# Patient Record
Sex: Female | Born: 1995 | Race: White | Hispanic: No | Marital: Single | Smoking: Never smoker
Health system: Southern US, Community
[De-identification: ages and names within clinical notes are randomized; demographics above are authoritative.]

## PROBLEM LIST (undated history)

## (undated) DIAGNOSIS — J302 Other seasonal allergic rhinitis: Secondary | ICD-10-CM

## (undated) DIAGNOSIS — R42 Dizziness and giddiness: Secondary | ICD-10-CM

## (undated) HISTORY — DX: Dizziness and giddiness: R42

## (undated) HISTORY — DX: Other seasonal allergic rhinitis: J30.2

---

## 2014-08-05 HISTORY — PX: WISDOM TOOTH EXTRACTION: SHX21

## 2020-07-05 ENCOUNTER — Encounter: Payer: Self-pay | Admitting: Neurology

## 2020-09-12 NOTE — Progress Notes (Addendum)
NEUROLOGY CONSULTATION NOTE  Caroline Booth MRN: [id number] DOB: [birth year]  Referring provider: Crist Fat, MD Primary care provider: Crist Fat, MD  Reason for consult:  Dizziness/vertigo   Subjective:  Caroline Booth is a 25 year old right-handed female who presents for dizziness/vertigo.  History supplemented by referring provider's note.  In November, she began experiencing dizziness.  She was standing at work and room started spinning spontaneously lasting 10 to 15 seconds.  It was not triggered by head movement or change in position.  This was followed by a lightheaded feeling (like she was going to pass out).  This feeling lasted 2 weeks.  No headache, nausea, vomiting, double vision, tinnitus, weakness, or palpitations, but noted numbness on the dorsum of feet and arms felt numb. The feeling wasn't aggravated or relieved it.  She would need to lay down at times because she felt like she may pass out, but she never did.  It lasted a couple of weeks and then resolved. She went to vestibular rehab where they were unable to reproduce it.  They didn't think it was positional.  In December, she had a recurrence of the same event, sudden onset vertigo for several seconds followed by 2 weeks of persistent dizziness/lightheadedness.  It happened a third time on New Year's Eve but symptoms only lasted a day.    She denies prior history of migraines.       PAST MEDICAL HISTORY: Seasonal allergies  PAST SURGICAL HISTORY: Foot/toe nail surgery Wisdom teeth extraction  MEDICATIONS: None  ALLERGIES: Cats Penicillin  FAMILY HISTORY: History reviewed. No pertinent family history.  SOCIAL HISTORY: Social History   Socioeconomic History  . Marital status: Single    Spouse name: Not on file  . Number of children: Not on file  . Years of education: Not on file  . Highest education level: Not on file  Occupational History  . Not on file  Tobacco Use  . Smoking status: Never  Smoker  . Smokeless tobacco: Never Used  Vaping Use  . Vaping Use: Never used  Substance and Sexual Activity  . Alcohol use: Yes    Comment: occ  . Drug use: Never  . Sexual activity: Not on file  Other Topics Concern  . Not on file  Social History Narrative   Right handed   Social Determinants of Health   Financial Resource Strain: Not on file  Food Insecurity: Not on file  Transportation Needs: Not on file  Physical Activity: Not on file  Stress: Not on file  Social Connections: Not on file  Intimate Partner Violence: Not on file    Objective:  Blood pressure 120/82, pulse 99, height 5\' 7"  (1.702 m), weight 202 lb 6.4 oz (91.8 kg), SpO2 94 %.  General: No acute distress.  Patient appears well-groomed.   Head:  Normocephalic/atraumatic Eyes:  fundi examined but not visualized Neck: supple, no paraspinal tenderness, full range of motion Back: No paraspinal tenderness Heart: regular rate and rhythm Lungs: Clear to auscultation bilaterally. Vascular: No carotid bruits. Neurological Exam: Mental status: alert and oriented to person, place, and time, recent and remote memory intact, fund of knowledge intact, attention and concentration intact, speech fluent and not dysarthric, language intact. Cranial nerves: CN I: not tested CN II: pupils equal, round and reactive to light, visual fields intact CN III, IV, VI:  full range of motion, no nystagmus, no ptosis CN V: facial sensation intact. CN VII: upper and lower face symmetric CN VIII:  hearing intact CN IX, X: gag intact, uvula midline CN XI: sternocleidomastoid and trapezius muscles intact CN XII: tongue midline Bulk & Tone: normal, no fasciculations. Motor:  muscle strength 5/5 throughout Sensation:  Pinprick, temperature and vibratory sensation intact. Deep Tendon Reflexes:  2+ throughout,  toes downgoing.   Finger to nose testing:  Without dysmetria.   Heel to shin:  Without dysmetria.   Gait:  Normal station and  stride.  Romberg negative.  Assessment/Plan:   1.  Episodic dizziness/altered sensorium with intermittent numbness and tingling - initiated by brief vertigo.  Not consistent with BPPV.  From a neurologic standpoint, may consider a migraine aura/vestibular migraine, atypical seizures less likely.  Given the lightheadedness, may consider cardiac etiology as well.  1.  Will check MRI of brain with and without contrast for intracranial abnormality 2.  Will check routine EEG 3.  Further recommendations pending results.  Follow up after testing.  If she should have a recurrence and wants to try treating for migraine, she will contact me and we can start a medication such as topiramate. 4.  If workup negative, she may want to discuss with her PCP about possible cardiac evaluation as well.    Thank you for allowing me to take part in the care of this patient.  Shon Millet, DO  CC: Crist Fat, MD  10/02/2020 ADDENDUM:  EEG from 09/25/2020 normal.  MRI Brain with and without contrast on 10/01/2020 normal.  No further neurologic workup warranted. Shon Millet

## 2020-09-13 ENCOUNTER — Other Ambulatory Visit: Payer: Self-pay

## 2020-09-13 ENCOUNTER — Encounter: Payer: Self-pay | Admitting: Neurology

## 2020-09-13 ENCOUNTER — Ambulatory Visit (INDEPENDENT_AMBULATORY_CARE_PROVIDER_SITE_OTHER): Payer: Managed Care, Other (non HMO) | Admitting: Neurology

## 2020-09-13 VITALS — BP 120/82 | HR 99 | Ht 67.0 in | Wt 202.4 lb

## 2020-09-13 DIAGNOSIS — R200 Anesthesia of skin: Secondary | ICD-10-CM | POA: Diagnosis not present

## 2020-09-13 DIAGNOSIS — R404 Transient alteration of awareness: Secondary | ICD-10-CM | POA: Diagnosis not present

## 2020-09-13 DIAGNOSIS — R202 Paresthesia of skin: Secondary | ICD-10-CM

## 2020-09-13 DIAGNOSIS — R42 Dizziness and giddiness: Secondary | ICD-10-CM | POA: Diagnosis not present

## 2020-09-13 NOTE — Patient Instructions (Addendum)
Etiology unclear, but if it is neurologic, it may be an atypical migraine 1.  We will check MRI of brain with and without contrast. We have sent a referral to [facility] for your MRI and they will call you directly to schedule your appointment. They are located at [address] [facility]. If you need to contact them directly please call [phone].  2.  We will check routine EEG 3.  Further recommendations pending results.  If you should have a recurrence and feel you may need to start a migraine medication, contact me 4.  If testing unremarkable, may want to discuss with your PCP about cardiac evaluation as well. 5.  Follow up after testing

## 2020-09-25 ENCOUNTER — Other Ambulatory Visit: Payer: Self-pay

## 2020-09-25 ENCOUNTER — Ambulatory Visit (INDEPENDENT_AMBULATORY_CARE_PROVIDER_SITE_OTHER): Payer: Managed Care, Other (non HMO) | Admitting: Neurology

## 2020-09-25 DIAGNOSIS — R404 Transient alteration of awareness: Secondary | ICD-10-CM | POA: Diagnosis not present

## 2020-09-25 NOTE — Procedures (Signed)
ELECTROENCEPHALOGRAM REPORT  Date of Study: 09/25/2020  Patient's Name: Caroline Booth MRN: [id number] Date of Birth: [birth year]  Clinical History: 24 year old female with recurrent dizzy spells.  Medications: None  Technical Summary: A multichannel digital EEG recording measured by the international 10-20 system with electrodes applied with paste and impedances below 5000 ohms performed in our laboratory with EKG monitoring in an awake and drowsy patient.  Hyperventilation not performed as patient is wearing a face mask due to the COVID-19 pandemic.  Photic stimulation was performed.  The digital EEG was referentially recorded, reformatted, and digitally filtered in a variety of bipolar and referential montages for optimal display.    Description: The patient is awake and drowsy during the recording.  During maximal wakefulness, there is a symmetric, medium voltage 11-12 Hz posterior dominant rhythm that attenuates with eye opening.  The record is symmetric.  During drowsiness, there is an increase in theta slowing of the background.  Stage 2 sleep was not seen.  Photic stimulation did not elicit any abnormalities.  There were no epileptiform discharges or electrographic seizures seen.    EKG lead was unremarkable.  Impression: This awake and drowsy EEG is normal.    Clinical Correlation: A normal EEG does not exclude a clinical diagnosis of epilepsy.  If further clinical questions remain, prolonged EEG may be helpful.  Clinical correlation is advised.   Shon Millet, DO

## 2020-09-27 ENCOUNTER — Telehealth: Payer: Self-pay

## 2020-09-27 NOTE — Telephone Encounter (Signed)
-----   Message from Drema Dallas, DO sent at 09/25/2020  4:11 PM EST ----- EEG normal

## 2020-09-27 NOTE — Telephone Encounter (Signed)
Called and informed patient of normal eeg.

## 2020-10-01 ENCOUNTER — Other Ambulatory Visit: Payer: Self-pay

## 2020-10-01 ENCOUNTER — Ambulatory Visit
Admission: RE | Admit: 2020-10-01 | Discharge: 2020-10-01 | Disposition: A | Discharge status: Home / Self Care | Payer: Managed Care, Other (non HMO) | Source: Ambulatory Visit | Attending: Neurology | Admitting: Neurology

## 2020-10-01 DIAGNOSIS — R404 Transient alteration of awareness: Secondary | ICD-10-CM

## 2020-10-01 DIAGNOSIS — R42 Dizziness and giddiness: Secondary | ICD-10-CM

## 2020-10-01 DIAGNOSIS — R202 Paresthesia of skin: Secondary | ICD-10-CM

## 2020-10-01 DIAGNOSIS — R200 Anesthesia of skin: Secondary | ICD-10-CM

## 2020-10-01 MED ORDER — GADOBENATE DIMEGLUMINE 529 MG/ML IV SOLN
19.0000 mL | Freq: Once | INTRAVENOUS | Status: AC | PRN
  Administered 2020-10-01: 19 mL via INTRAVENOUS

## 2020-10-23 ENCOUNTER — Other Ambulatory Visit: Payer: Self-pay

## 2020-10-23 DIAGNOSIS — R42 Dizziness and giddiness: Secondary | ICD-10-CM | POA: Insufficient documentation

## 2020-10-24 ENCOUNTER — Encounter: Payer: Self-pay | Admitting: Cardiology

## 2020-10-24 ENCOUNTER — Ambulatory Visit: Payer: Managed Care, Other (non HMO) | Admitting: Cardiology

## 2020-10-24 ENCOUNTER — Ambulatory Visit (INDEPENDENT_AMBULATORY_CARE_PROVIDER_SITE_OTHER): Payer: Managed Care, Other (non HMO)

## 2020-10-24 ENCOUNTER — Other Ambulatory Visit: Payer: Self-pay

## 2020-10-24 VITALS — BP 116/80 | HR 104 | Ht 67.0 in | Wt 200.0 lb

## 2020-10-24 DIAGNOSIS — R000 Tachycardia, unspecified: Secondary | ICD-10-CM | POA: Diagnosis not present

## 2020-10-24 DIAGNOSIS — R002 Palpitations: Secondary | ICD-10-CM | POA: Insufficient documentation

## 2020-10-24 DIAGNOSIS — R011 Cardiac murmur, unspecified: Secondary | ICD-10-CM | POA: Insufficient documentation

## 2020-10-24 DIAGNOSIS — R42 Dizziness and giddiness: Secondary | ICD-10-CM | POA: Diagnosis not present

## 2020-10-24 NOTE — Patient Instructions (Signed)
Medication Instructions:  No medication changes. *If you need a refill on your cardiac medications before your next appointment, please call your pharmacy*   Lab Work: None ordered If you have labs (blood work) drawn today and your tests are completely normal, you will receive your results only by: Marland Kitchen MyChart Message (if you have MyChart) OR . A paper copy in the mail If you have any lab test that is abnormal or we need to change your treatment, we will call you to review the results.   Testing/Procedures: Your physician has requested that you have an echocardiogram. Echocardiography is a painless test that uses sound waves to create images of your heart. It provides your doctor with information about the size and shape of your heart and how well your heart's chambers and valves are working. This procedure takes approximately one hour. There are no restrictions for this procedure.   WHY IS MY DOCTOR PRESCRIBING ZIO? The Zio system is proven and trusted by physicians to detect and diagnose irregular heart rhythms -- and has been prescribed to hundreds of thousands of patients.  The FDA has cleared the Zio system to monitor for many different kinds of irregular heart rhythms. In a study, physicians were able to reach a diagnosis 90% of the time with the Zio system1.  You can wear the Zio monitor -- a small, discreet, comfortable patch -- during your normal day-to-day activity, including while you sleep, shower, and exercise, while it records every single heartbeat for analysis.  1Barrett, P., et al. Comparison of 24 Hour Holter Monitoring Versus 14 Day Novel Adhesive Patch Electrocardiographic Monitoring. American Journal of Medicine, 2014.  ZIO VS. HOLTER MONITORING The Zio monitor can be comfortably worn for up to 14 days. Holter monitors can be worn for 24 to 48 hours, limiting the time to record any irregular heart rhythms you may have. Zio is able to capture data for the 51% of patients  who have their first symptom-triggered arrhythmia after 48 hours.1  LIVE WITHOUT RESTRICTIONS The Zio ambulatory cardiac monitor is a small, unobtrusive, and water-resistant patch--you might even forget you're wearing it. The Zio monitor records and stores every beat of your heart, whether you're sleeping, working out, or showering.  Wear the monitor for 2 weeks, remove 11/07/20.   Follow-Up: At [facility], you and your health needs are our priority.  As part of our continuing mission to provide you with exceptional heart care, we have created designated Provider Care Teams.  These Care Teams include your primary Cardiologist (physician) and Advanced Practice Providers (APPs -  Physician Assistants and Nurse Practitioners) who all work together to provide you with the care you need, when you need it.  We recommend signing up for the patient portal called "MyChart".  Sign up information is provided on this After Visit Summary.  MyChart is used to connect with patients for Virtual Visits (Telemedicine).  Patients are able to view lab/test results, encounter notes, upcoming appointments, etc.  Non-urgent messages can be sent to your provider as well.   To learn more about what you can do with MyChart, go to [email or web address].    Your next appointment:   2 month(s)  The format for your next appointment:   In Person  Provider:   Belva Crome, MD   Other Instructions  Echocardiogram An echocardiogram is a test that uses sound waves (ultrasound) to produce images of the heart. Images from an echocardiogram can provide important information about:  Heart size and shape.  The size and thickness and movement of your heart's walls.  Heart muscle function and strength.  Heart valve function or if you have stenosis. Stenosis is when the heart valves are too narrow.  If blood is flowing backward through the heart valves (regurgitation).  A tumor or infectious growth around the  heart valves.  Areas of heart muscle that are not working well because of poor blood flow or injury from a heart attack.  Aneurysm detection. An aneurysm is a weak or damaged part of an artery wall. The wall bulges out from the normal force of blood pumping through the body. Tell a health care provider about:  Any allergies you have.  All medicines you are taking, including vitamins, herbs, eye drops, creams, and over-the-counter medicines.  Any blood disorders you have.  Any surgeries you have had.  Any medical conditions you have.  Whether you are pregnant or may be pregnant. What are the risks? Generally, this is a safe test. However, problems may occur, including an allergic reaction to dye (contrast) that may be used during the test. What happens before the test? No specific preparation is needed. You may eat and drink normally. What happens during the test?  You will take off your clothes from the waist up and put on a hospital gown.  Electrodes or electrocardiogram (ECG)patches may be placed on your chest. The electrodes or patches are then connected to a device that monitors your heart rate and rhythm.  You will lie down on a table for an ultrasound exam. A gel will be applied to your chest to help sound waves pass through your skin.  A handheld device, called a transducer, will be pressed against your chest and moved over your heart. The transducer produces sound waves that travel to your heart and bounce back (or "echo" back) to the transducer. These sound waves will be captured in real-time and changed into images of your heart that can be viewed on a video monitor. The images will be recorded on a computer and reviewed by your health care provider.  You may be asked to change positions or hold your breath for a short time. This makes it easier to get different views or better views of your heart.  In some cases, you may receive contrast through an IV in one of your  veins. This can improve the quality of the pictures from your heart. The procedure may vary among health care providers and hospitals.   What can I expect after the test? You may return to your normal, everyday life, including diet, activities, and medicines, unless your health care provider tells you not to do that. Follow these instructions at home:  It is up to you to get the results of your test. Ask your health care provider, or the department that is doing the test, when your results will be ready.  Keep all follow-up visits. This is important. Summary  An echocardiogram is a test that uses sound waves (ultrasound) to produce images of the heart.  Images from an echocardiogram can provide important information about the size and shape of your heart, heart muscle function, heart valve function, and other possible heart problems.  You do not need to do anything to prepare before this test. You may eat and drink normally.  After the echocardiogram is completed, you may return to your normal, everyday life, unless your health care provider tells you not to do that. This information is not intended to replace advice  given to you by your health care provider. Make sure you discuss any questions you have with your health care provider. Document Revised: 03/14/2020 Document Reviewed: 03/14/2020 Elsevier Patient Education  2021 [employer].

## 2020-10-24 NOTE — Progress Notes (Signed)
Cardiology Office Note:    Date:  10/24/2020   ID:  Caroline Booth, DOB 08-15-1995, MRN [id number]  PCP:  Crist Fat, MD  Cardiologist:  Garwin Brothers, MD   Referring MD: Crist Fat, MD    ASSESSMENT:    1. Dizziness   2. Sinus tachycardia   3. Palpitations   4. Cardiac murmur    PLAN:    In order of problems listed above:  1. Primary prevention stressed with the patient.  Importance of compliance with diet medication stressed and she vocalized understanding. 2. Palpitations: Sinus tachycardia: I discussed my findings with the patient at length and reassured her.  Recent TSH in December was unremarkable.  We will do a 2-week monitor to assess the symptoms.  She has never had a passing out spell.  She has had dizziness in the past.  Hopefully this monitor will help [place] understand her baseline rhythm.  I told her to keep herself well-hydrated.  I also mentioned to the patient to walk on a regular basis to the best of her ability.  And increase effort in a graded fashion. 3. Cardiac murmur: Echocardiogram will be done to assess murmur heard on auscultation.  She knows to go to the nearest emergency room for any concerning symptoms. 4. Patient will be seen in follow-up appointment in 3 months or earlier if the patient has any concerns    Medication Adjustments/Labs and Tests Ordered: Current medicines are reviewed at length with the patient today.  Concerns regarding medicines are outlined above.  No orders of the defined types were placed in this encounter.  No orders of the defined types were placed in this encounter.    History of Present Illness:    Caroline Booth is a 25 y.o. female who is being seen today for the evaluation of palpitations and sinus tachycardia and dizziness at the request of Crist Fat, MD.  Patient is a pleasant 25 year old female.  She has past medical history that is not much significant.  She has had history of vertigo.  She mentions to me that  she feels dizzy at times.  Her vertigo is much better.  She was sent here for evaluation for cardiac etiology for the symptoms as a possibility.  She tells me that her heart rate is fast and it has been so for a long time.  At the time of my evaluation, the patient is alert awake oriented and in no distress.  Past Medical History:  Diagnosis Date  . Dizziness   . Vertigo     Past Surgical History:  Procedure Laterality Date  . WISDOM TOOTH EXTRACTION  2016    Current Medications: Current Meds  Medication Sig  . fexofenadine-pseudoephedrine (ALLEGRA-D 24) 180-240 MG 24 hr tablet Take 1 tablet by mouth daily.     Allergies:   Penicillins   Social History   Socioeconomic History  . Marital status: Single    Spouse name: Not on file  . Number of children: Not on file  . Years of education: Not on file  . Highest education level: Not on file  Occupational History  . Not on file  Tobacco Use  . Smoking status: Never Smoker  . Smokeless tobacco: Never Used  Vaping Use  . Vaping Use: Never used  Substance and Sexual Activity  . Alcohol use: Yes    Comment: occ  . Drug use: Never  . Sexual activity: Not on file  Other Topics Concern  .  Not on file  Social History Narrative   Right handed   Social Determinants of Health   Financial Resource Strain: Not on file  Food Insecurity: Not on file  Transportation Needs: Not on file  Physical Activity: Not on file  Stress: Not on file  Social Connections: Not on file     Family History: The patient's family history includes Diabetes in her maternal grandmother; Heart disease in her maternal grandfather; Hypertension in her father.  ROS:   Please see the history of present illness.    All other systems reviewed and are negative.  EKGs/Labs/Other Studies Reviewed:    The following studies were reviewed today: EKG reveals sinus tachycardia and nonspecific ST-T changes   Recent Labs: No results found for requested labs  within last 8760 hours.  Recent Lipid Panel No results found for: CHOL, TRIG, HDL, CHOLHDL, VLDL, LDLCALC, LDLDIRECT  Physical Exam:    VS:  BP 116/80   Pulse (!) 104   Ht 5\' 7"  (1.702 m)   Wt 200 lb (90.7 kg)   SpO2 98%   BMI 31.32 kg/m     Wt Readings from Last 3 Encounters:  10/24/20 200 lb (90.7 kg)  09/13/20 202 lb 6.4 oz (91.8 kg)     GEN: Patient is in no acute distress HEENT: Normal NECK: No JVD; No carotid bruits LYMPHATICS: No lymphadenopathy CARDIAC: S1 S2 regular, 2/6 systolic murmur at the apex. RESPIRATORY:  Clear to auscultation without rales, wheezing or rhonchi  ABDOMEN: Soft, non-tender, non-distended MUSCULOSKELETAL:  No edema; No deformity  SKIN: Warm and dry NEUROLOGIC:  Alert and oriented x 3 PSYCHIATRIC:  Normal affect    Signed, 11/11/20, MD  10/24/2020 1:28 PM    [facility]

## 2020-11-15 ENCOUNTER — Ambulatory Visit (INDEPENDENT_AMBULATORY_CARE_PROVIDER_SITE_OTHER): Payer: Managed Care, Other (non HMO)

## 2020-11-15 ENCOUNTER — Other Ambulatory Visit: Payer: Self-pay

## 2020-11-15 DIAGNOSIS — R002 Palpitations: Secondary | ICD-10-CM

## 2020-11-15 DIAGNOSIS — R011 Cardiac murmur, unspecified: Secondary | ICD-10-CM

## 2020-11-15 LAB — ECHOCARDIOGRAM COMPLETE
Area-P 1/2: 4.15 cm2
S' Lateral: 2.8 cm

## 2020-11-16 ENCOUNTER — Telehealth: Payer: Self-pay

## 2020-11-16 NOTE — Telephone Encounter (Signed)
Left message on patients voicemail to please return our call.   

## 2020-11-16 NOTE — Telephone Encounter (Signed)
Pt is returning a call to [place] about Lab results from this morning

## 2020-11-16 NOTE — Telephone Encounter (Signed)
-----   Message from Baldo Daub, MD sent at 11/16/2020  9:25 AM EDT ----- This is normal was ordered by Dr. Tomie China not quite sure why it was sent to me

## 2020-11-16 NOTE — Telephone Encounter (Signed)
Results reviewed with pt as per Dr. Munley's note.  Pt verbalized understanding and had no additional questions. Routed to PCP  

## 2020-12-21 ENCOUNTER — Ambulatory Visit: Payer: Managed Care, Other (non HMO) | Admitting: Cardiology

## 2020-12-26 ENCOUNTER — Ambulatory Visit: Payer: Managed Care, Other (non HMO) | Admitting: Cardiology

## 2021-02-02 ENCOUNTER — Ambulatory Visit: Payer: Managed Care, Other (non HMO) | Admitting: Neurology

## 2021-03-06 IMAGING — MR MR HEAD WO/W CM
13 series · 48 of 48 positions shown · IV contrast (multihance)
Comparison: None.

CLINICAL DATA: Dizziness. Numbness and tingling in both
extremities.

EXAM:
MRI HEAD WITHOUT AND WITH CONTRAST
TECHNIQUE: Multiplanar, multiecho pulse sequences of the brain and surrounding
structures were obtained without and with intravenous contrast.
CONTRAST:  19mL MULTIHANCE GADOBENATE DIMEGLUMINE 529 MG/ML IV SOLN

[Series 2: T1 · sagittal · 5.0mm · 0.47mm/px · 2 of 24 slices shown]
[im 1/24]
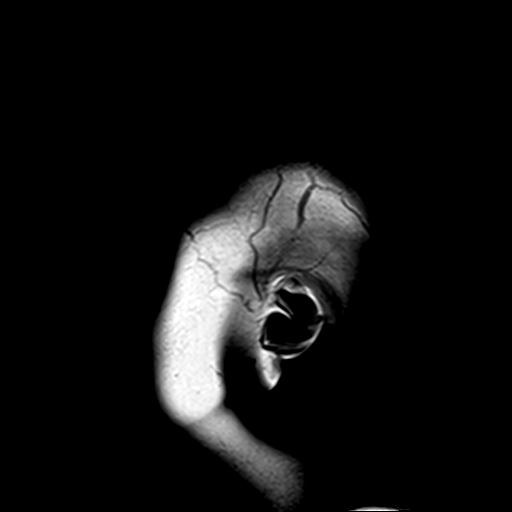
[im 24/24]
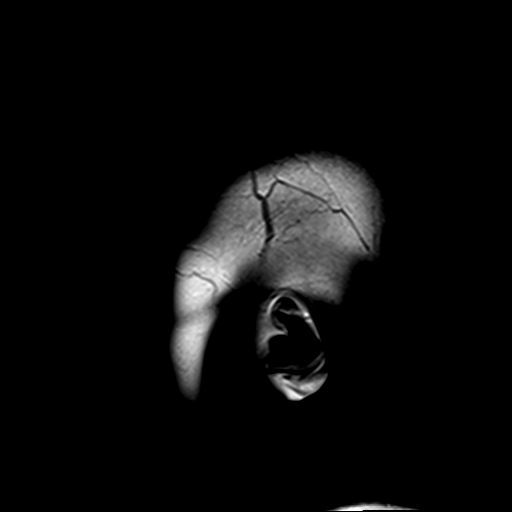

[Series 3: DWI · axial · 3.0mm · 1.80mm/px · z∈[-81,+71]mm · 6 of 104 slices shown (1 of 4)]
[im 1/104]
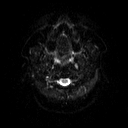
[im 21/104]
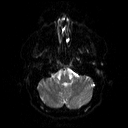
[im 42/104]
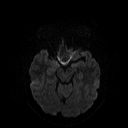
[im 62/104]
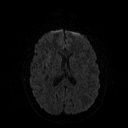
[im 83/104]
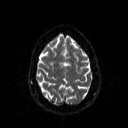
[im 104/104]
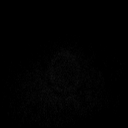

[Series 4: DWI · axial · 3.0mm · 1.80mm/px · z∈[-81,+71]mm · 3 of 52 slices shown (2 of 4)]
[im 1/52]
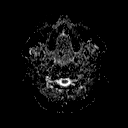
[im 26/52]
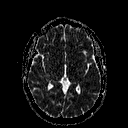
[im 52/52]
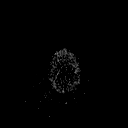

[Series 5: DWI · coronal · 5.0mm · 1.80mm/px · 4 of 72 slices shown (3 of 4)]
[im 1/72]
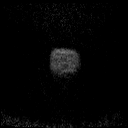
[im 24/72]
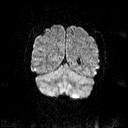
[im 48/72]
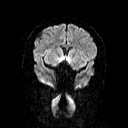
[im 72/72]
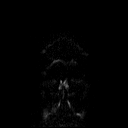

[Series 6: DWI · coronal · 5.0mm · 1.80mm/px · 2 of 37 slices shown (4 of 4)]
[im 1/37]
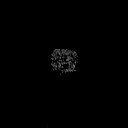
[im 37/37]
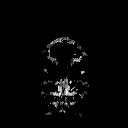

[Series 7: T2 · axial · 5.0mm · 0.72mm/px · 1 of 24 slices shown (1 of 2)]
[im 1/24]
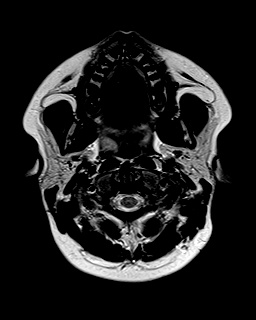

[Series 8: FLAIR · axial · 3.0mm · 0.45mm/px · z∈[-83,+70]mm · 2 of 34 slices shown]
[im 1/34]
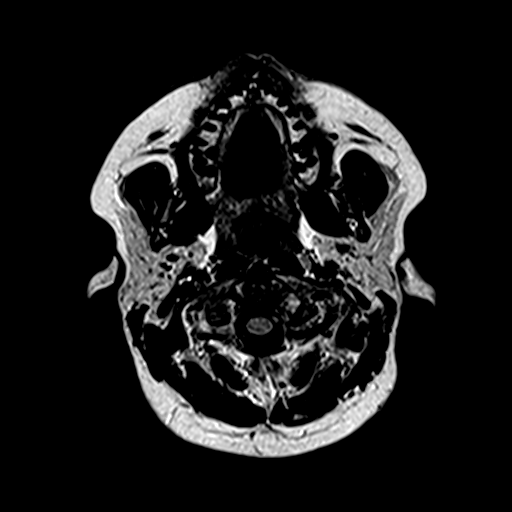
[im 34/34]
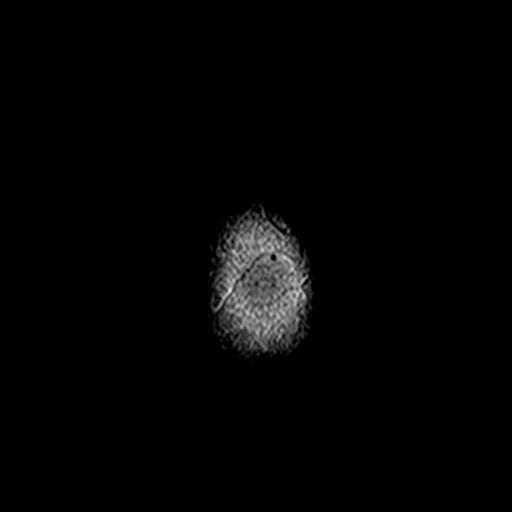

[Series 9: mip_images(sw) · axial · 32.0mm · 0.90mm/px · z∈[-69,+59]mm · 2 of 33 slices shown]
[im 1/33]
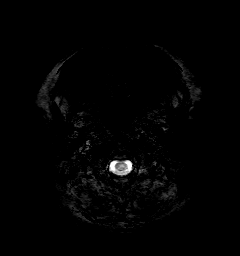
[im 33/33]
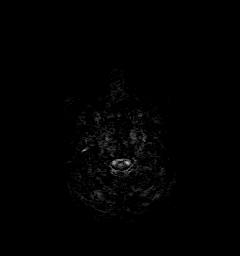

[Series 10: swi_images · axial · 4.0mm · 0.90mm/px · z∈[-83,+73]mm · 2 of 40 slices shown]
[im 1/40]
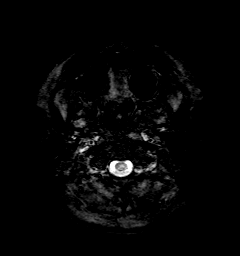
[im 40/40]
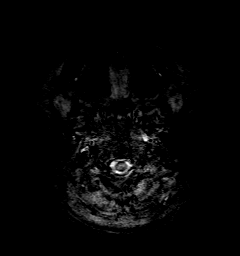

[Series 11: t1_mpr_tra · axial · 1.0mm · 0.75mm/px · z∈[-83,+75]mm · 10 of 160 slices shown]
[im 1/160]
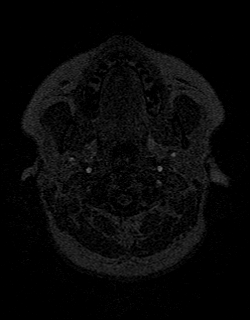
[im 18/160]
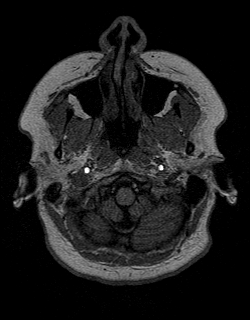
[im 36/160]
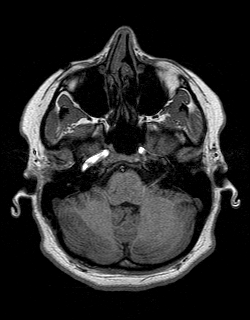
[im 54/160]
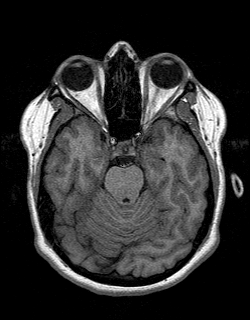
[im 71/160]
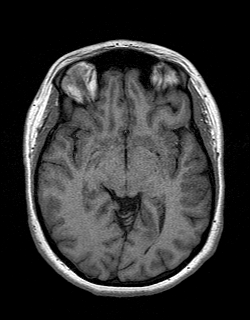
[im 89/160]
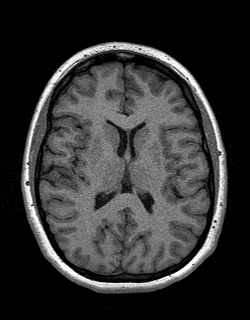
[im 107/160]
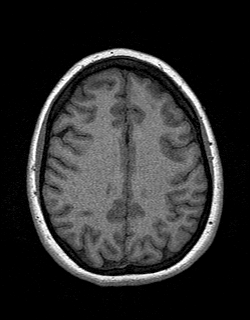
[im 124/160]
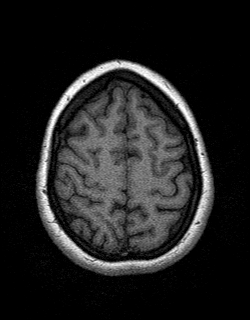
[im 142/160]
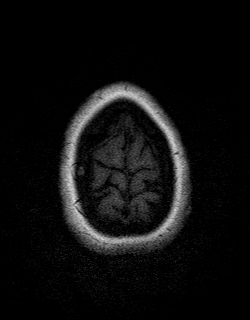
[im 160/160]
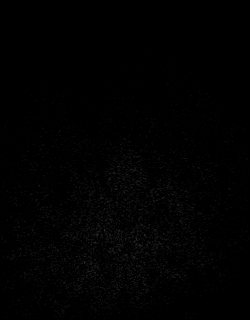

[Series 12: T2 · coronal · 5.0mm · 0.45mm/px · 2 of 28 slices shown (2 of 2)]
[im 1/28]
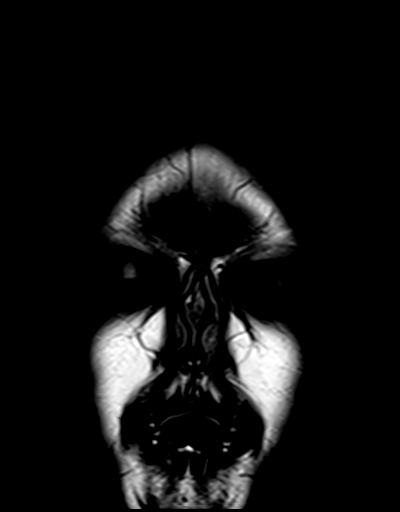
[im 28/28]
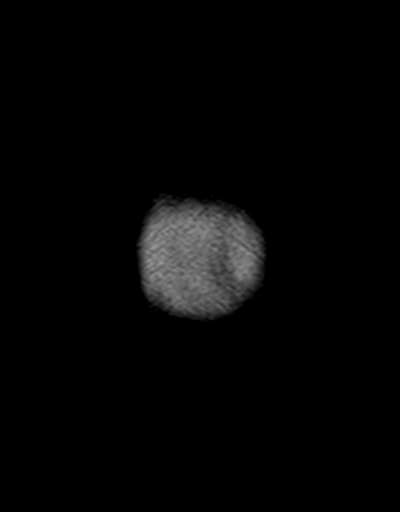

[Series 13: t1_mpr_tra post · axial · 1.0mm · 0.75mm/px · z∈[-83,+75]mm · 10 of 160 slices shown]
[im 1/160]
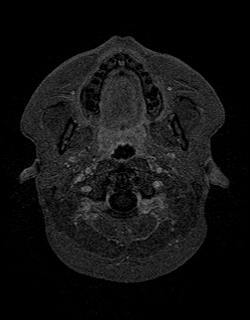
[im 18/160]
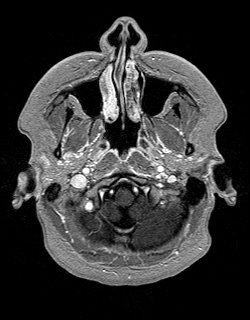
[im 36/160]
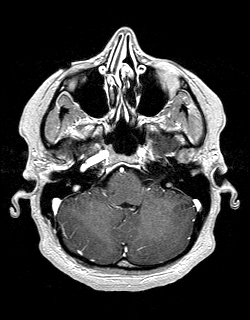
[im 54/160]
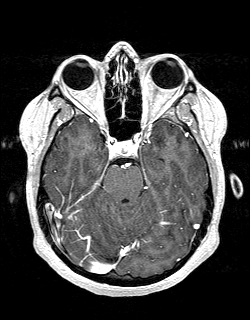
[im 71/160]
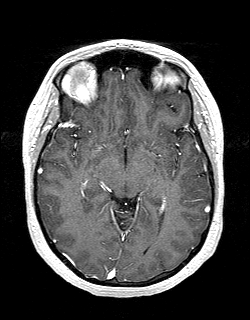
[im 89/160]
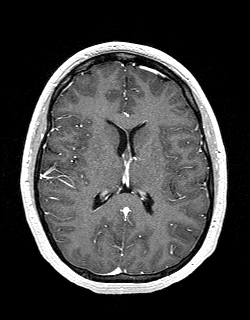
[im 107/160]
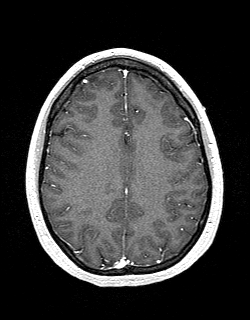
[im 124/160]
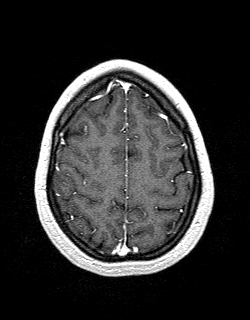
[im 142/160]
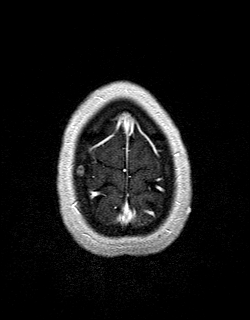
[im 160/160]
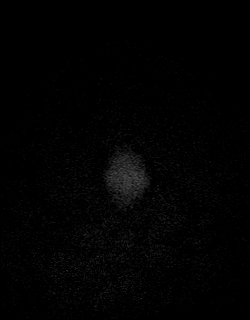

[Series 14: post cor · coronal · 5.0mm · 0.45mm/px · 2 of 28 slices shown]
[im 1/28]
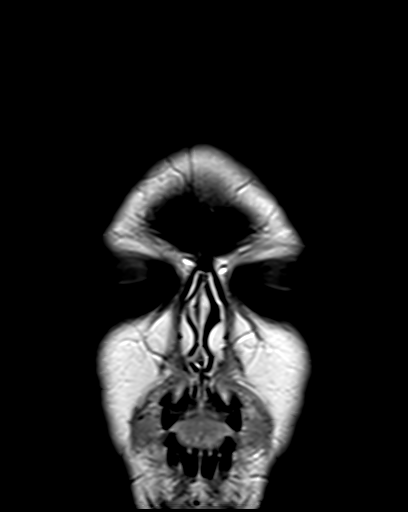
[im 28/28]
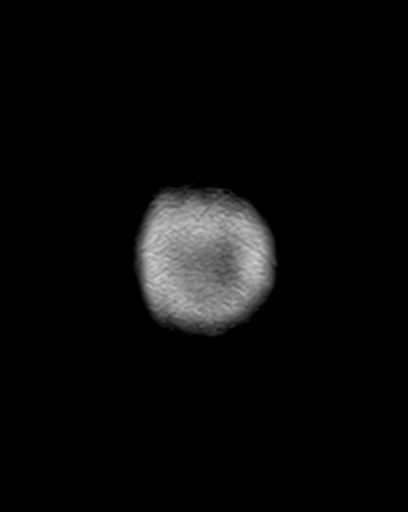

[48 of 48 positions shown; findings below may reference images not displayed]

FINDINGS: Brain: No acute infarction, hemorrhage, hydrocephalus, extra-axial
collection or mass lesion. Normal position of the cerebellar
tonsils. No substantial white matter disease. No abnormal
enhancement.

Vascular: Major arterial flow voids are maintained at the skull
base.

Skull and upper cervical spine: Normal marrow signal.

Sinuses/Orbits: Sinuses are largely clear.  Unremarkable orbits.

Other: No sizable mastoid effusions.
IMPRESSION: No evidence of acute intracranial abnormality.

## 2022-08-14 ENCOUNTER — Ambulatory Visit: Payer: Managed Care, Other (non HMO) | Admitting: Internal Medicine

## 2023-06-02 ENCOUNTER — Encounter: Payer: Self-pay | Admitting: Internal Medicine

## 2023-06-02 ENCOUNTER — Ambulatory Visit: Payer: Managed Care, Other (non HMO) | Admitting: Internal Medicine

## 2023-06-02 VITALS — BP 116/74 | HR 83 | Temp 98.3°F | Resp 18 | Ht 67.0 in | Wt 204.4 lb

## 2023-06-02 DIAGNOSIS — Z6832 Body mass index (BMI) 32.0-32.9, adult: Secondary | ICD-10-CM

## 2023-06-02 DIAGNOSIS — E66811 Obesity, class 1: Secondary | ICD-10-CM

## 2023-06-02 DIAGNOSIS — Z0000 Encounter for general adult medical examination without abnormal findings: Secondary | ICD-10-CM | POA: Insufficient documentation

## 2023-06-02 DIAGNOSIS — J302 Other seasonal allergic rhinitis: Secondary | ICD-10-CM | POA: Diagnosis not present

## 2023-06-02 DIAGNOSIS — E6609 Other obesity due to excess calories: Secondary | ICD-10-CM | POA: Insufficient documentation

## 2023-06-02 DIAGNOSIS — R000 Tachycardia, unspecified: Secondary | ICD-10-CM | POA: Diagnosis not present

## 2023-06-02 NOTE — Assessment & Plan Note (Signed)
Health maintenance discussed.  We will obtain some yearly labs. 

## 2023-06-02 NOTE — Assessment & Plan Note (Signed)
She has seen cardiology and has had a holter monitor and ECHO and her workup was unremarkable.  We will continue to monitor.

## 2023-06-02 NOTE — Progress Notes (Signed)
Office Visit  Subjective   Patient ID: Caroline Booth   DOB: 09-22-1995   Age: 27 y.o.   MRN: [id number]   Chief Complaint Chief Complaint  Patient presents with   Annual Exam     History of Present Illness Caroline Booth is a 27 year old Caucasian/White female who presents for her annual health maintenance exam. She is due for the following health maintenance studies: screening labs. This patient's past medical history Seasonal Allergies.   Her last eye exam was in 07/2022 and she states her vision is doing well.  There is no family history of colorectal, uterine or ovarian cancer.  Her maternal aunt has been diagnosed with breast cancer in her 18's.  She has never had a colonscopy.  She has never had a PAP smear.  She is not sexually active. The patient is exercising with walking.  The patient has never smoked.  She does get yearly flu vaccines.  She has had 3 COVID-19 vaccines including 1 booster.  There is no family history of heart disease or strokes.  She denies any depression or anxiety.    I did see Caroline Booth in 2022 for recurrent vertigo/dizziness where she was initally seen in 06/2020.  We sent her to vestibular rehab and the could not initiate her dizziness with their procedures.  I therefore sent her to neurology who did a MRI of her brain which she states was normal as well as an EEG which was normal.  Neurology recommended that she go for a cardiology evaluation which she went and had an ECHO which she states was normal and a heart monitor which was normal.  They wondered if she had a vestibular migraine causing her dizziness.   Today, there is no loss of hearing, tinnitus, headaches, focal weakness/numbness, dizziness or other problems.  She states her vertigo may occur every 3 months but is mild.  She was also noted by cardiology to have tachycardia.  She states that during her workup they saw no problem and she has had tachycardia since about 2016.     She does have a history of  seasonal allergies which effects her throughout most of the year.  Her symptoms include sinus congestion, runny nose with sneezing and rhinorrhea with post nasal drip.  She takes allegra as needed which seems to control her symptoms.  She has never been allergy tested.  She also contracted COVID-19 in 08/2020 and on 04/2022.  She recovered from this and has no long term sequalae.             Past Medical History Past Medical History:  Diagnosis Date   Dizziness    Seasonal allergies    Vertigo      Allergies Allergies  Allergen Reactions   Penicillins Anaphylaxis, Hives, Itching and Shortness Of Breath     Medications  Current Outpatient Medications:    fexofenadine-pseudoephedrine (ALLEGRA-D 24) 180-240 MG 24 hr tablet, Take 1 tablet by mouth daily., Disp: , Rfl:    Review of Systems Review of Systems  Constitutional:  Negative for chills, fever and malaise/fatigue.  Eyes:  Negative for blurred vision.  Respiratory:  Negative for cough, shortness of breath and wheezing.   Cardiovascular:  Negative for chest pain, palpitations and leg swelling.  Gastrointestinal:  Negative for abdominal pain, blood in stool, constipation, diarrhea, heartburn, melena, nausea and vomiting.  Genitourinary:  Negative for frequency and hematuria.  Musculoskeletal:  Negative for myalgias.  Skin:  Negative for itching  and rash.  Neurological:  Negative for dizziness, weakness and headaches.  Endo/Heme/Allergies:  Negative for polydipsia.  Psychiatric/Behavioral:  Negative for depression. The patient is not nervous/anxious.        Objective:    Vitals BP 116/74   Pulse 83   Temp 98.3 F (36.8 C)   Resp 18   Ht 5\' 7"  (1.702 m)   Wt 204 lb 6.4 oz (92.7 kg)   SpO2 99%   BMI 32.01 kg/m    Physical Examination Physical Exam Constitutional:      Appearance: Normal appearance. She is not ill-appearing.  HENT:     Head: Normocephalic and atraumatic.     Right Ear: Tympanic membrane,  ear canal and external ear normal.     Left Ear: Tympanic membrane, ear canal and external ear normal.     Nose: Nose normal. No congestion or rhinorrhea.     Mouth/Throat:     Mouth: Mucous membranes are moist.     Pharynx: Oropharynx is clear. No oropharyngeal exudate or posterior oropharyngeal erythema.  Eyes:     General: No scleral icterus.    Conjunctiva/sclera: Conjunctivae normal.     Pupils: Pupils are equal, round, and reactive to light.  Neck:     Vascular: No carotid bruit.  Cardiovascular:     Rate and Rhythm: Normal rate and regular rhythm.     Pulses: Normal pulses.     Heart sounds: No murmur heard.    No friction rub. No gallop.  Pulmonary:     Effort: Pulmonary effort is normal. No respiratory distress.     Breath sounds: No wheezing, rhonchi or rales.  Abdominal:     General: Bowel sounds are normal. There is no distension.     Palpations: Abdomen is soft.     Tenderness: There is no abdominal tenderness.  Musculoskeletal:     Cervical back: Neck supple. No tenderness.     Right lower leg: No edema.     Left lower leg: No edema.  Lymphadenopathy:     Cervical: No cervical adenopathy.  Skin:    General: Skin is warm and dry.     Findings: No rash.  Neurological:     General: No focal deficit present.     Mental Status: She is alert and oriented to person, place, and time.  Psychiatric:        Mood and Affect: Mood normal.        Behavior: Behavior normal.        Assessment & Plan:   Annual physical exam Health maintenance discussed.  We will obtain some yearly labs.  BMI 32.0-32.9,adult She has obesity where I want her to eat healthy, reduce her calories and continue to exercise and lose weight.  Class 1 obesity due to excess calories without serious comorbidity with body mass index (BMI) of 32.0 to 32.9 in adult Plan as above.  Seasonal allergies She can continue OTC allegra as needed for symptoms when they occur.  Sinus tachycardia She has  seen cardiology and has had a holter monitor and ECHO and her workup was unremarkable.  We will continue to monitor.    Return in about 1 year (around 06/02/2024) for annual.   Crist Fat, MD

## 2023-06-02 NOTE — Assessment & Plan Note (Signed)
Plan as above.  

## 2023-06-02 NOTE — Assessment & Plan Note (Signed)
She has obesity where I want her to eat healthy, reduce her calories and continue to exercise and lose weight.

## 2023-06-02 NOTE — Assessment & Plan Note (Signed)
She can continue OTC allegra as needed for symptoms when they occur.

## 2023-06-03 LAB — CMP14 + ANION GAP
ALT: 19 [IU]/L (ref 0–32)
AST: 16 [IU]/L (ref 0–40)
Albumin: 4.5 g/dL (ref 4.0–5.0)
Alkaline Phosphatase: 75 [IU]/L (ref 44–121)
Anion Gap: 17 mmol/L (ref 10.0–18.0)
BUN/Creatinine Ratio: 11 (ref 9–23)
BUN: 9 mg/dL (ref 6–20)
Bilirubin Total: 0.4 mg/dL (ref 0.0–1.2)
CO2: 19 mmol/L — ABNORMAL LOW (ref 20–29)
Calcium: 9.8 mg/dL (ref 8.7–10.2)
Chloride: 103 mmol/L (ref 96–106)
Creatinine, Ser: 0.8 mg/dL (ref 0.57–1.00)
Globulin, Total: 2.6 g/dL (ref 1.5–4.5)
Glucose: 84 mg/dL (ref 70–99)
Potassium: 4.4 mmol/L (ref 3.5–5.2)
Sodium: 139 mmol/L (ref 134–144)
Total Protein: 7.1 g/dL (ref 6.0–8.5)
eGFR: 103 mL/min/{1.73_m2} (ref >59)

## 2023-06-03 LAB — CBC WITH DIFFERENTIAL/PLATELET
Basophils Absolute: 0.1 10*3/uL (ref 0.0–0.2)
Basos: 1 %
EOS (ABSOLUTE): 0.3 10*3/uL (ref 0.0–0.4)
Eos: 5 %
Hematocrit: 45.3 % (ref 34.0–46.6)
Hemoglobin: 14.8 g/dL (ref 11.1–15.9)
Immature Grans (Abs): 0 10*3/uL (ref 0.0–0.1)
Immature Granulocytes: 1 %
Lymphocytes Absolute: 1.6 10*3/uL (ref 0.7–3.1)
Lymphs: 26 %
MCH: 30.5 pg (ref 26.6–33.0)
MCHC: 32.7 g/dL (ref 31.5–35.7)
MCV: 93 fL (ref 79–97)
Monocytes Absolute: 0.4 10*3/uL (ref 0.1–0.9)
Monocytes: 7 %
Neutrophils Absolute: 3.9 10*3/uL (ref 1.4–7.0)
Neutrophils: 60 %
Platelets: 232 10*3/uL (ref 150–450)
RBC: 4.86 x10E6/uL (ref 3.77–5.28)
RDW: 12.6 % (ref 11.7–15.4)
WBC: 6.3 10*3/uL (ref 3.4–10.8)

## 2023-06-03 LAB — HEMOGLOBIN A1C
Est. average glucose Bld gHb Est-mCnc: 103 mg/dL
Hgb A1c MFr Bld: 5.2 % (ref 4.8–5.6)

## 2023-06-03 LAB — LIPID PANEL
Chol/HDL Ratio: 4.6 ratio — ABNORMAL HIGH (ref 0.0–4.4)
Cholesterol, Total: 188 mg/dL (ref 100–199)
HDL: 41 mg/dL (ref >39)
LDL Chol Calc (NIH): 130 mg/dL — ABNORMAL HIGH (ref 0–99)
Triglycerides: 91 mg/dL (ref 0–149)
VLDL Cholesterol Cal: 17 mg/dL (ref 5–40)

## 2023-06-03 LAB — TSH: TSH: 1.57 u[IU]/mL (ref 0.450–4.500)

## 2023-08-01 ENCOUNTER — Encounter: Payer: Managed Care, Other (non HMO) | Admitting: Radiology

## 2023-12-04 ENCOUNTER — Encounter: Payer: Self-pay | Admitting: Internal Medicine

## 2023-12-04 ENCOUNTER — Telehealth: Admitting: Internal Medicine

## 2023-12-04 ENCOUNTER — Other Ambulatory Visit: Payer: Self-pay | Admitting: Internal Medicine

## 2023-12-04 VITALS — HR 97 | Temp 99.9°F | Resp 18 | Ht 67.0 in | Wt 210.0 lb

## 2023-12-04 DIAGNOSIS — U071 COVID-19: Secondary | ICD-10-CM | POA: Insufficient documentation

## 2023-12-04 DIAGNOSIS — R0981 Nasal congestion: Secondary | ICD-10-CM | POA: Diagnosis not present

## 2023-12-04 LAB — POC COVID19 BINAXNOW: SARS Coronavirus 2 Ag: POSITIVE — AB

## 2023-12-04 MED ORDER — PAXLOVID (300/100) 20 X 150 MG & 10 X 100MG PO TBPK: 3.0000 | ORAL_TABLET | Freq: Two times a day (BID) | ORAL | 0 refills | Status: AC

## 2023-12-04 MED ORDER — OXYMETAZOLINE HCL 0.05 % NA SOLN: 1.0000 | Freq: Two times a day (BID) | NASAL | 0 refills | Status: AC

## 2023-12-04 MED ORDER — PAXLOVID (300/100) 20 X 150 MG & 10 X 100MG PO TBPK: 3.0000 | ORAL_TABLET | Freq: Two times a day (BID) | ORAL | 0 refills | Status: DC

## 2023-12-04 NOTE — Assessment & Plan Note (Signed)
 Her COVID test is positive. I will start her on Paxlovid  and she will stay home for 7 days. She will call if her symptoms are not better. She will drink plenty of water. I will end note for work.

## 2023-12-04 NOTE — Progress Notes (Signed)
   Acute Office Visit  Subjective:     Patient ID: Caroline Booth, female    DOB: 19-Jul-1996, 28 y.o.   MRN: [id number]  Chief Complaint  Patient presents with   Acute Visit    Pt reports on Monday she started having nasal congestion, and this  morning her tonsils were swollen with a sore throat. The patient denies body aches, fever, and chills.     HPI Patient is in today for is here c/o nasal congestion and sore throat for 3 days. This morning she woke up with worsening of sore throat. She she wanted to be checked. No shortness of breath, no wheezing. No fever or chills. She admit that she has seasonal allergies and takes allegra for that. She has been using mucinex for this illness.   Review of Systems  Constitutional: Negative.   HENT:  Positive for congestion.   Respiratory:  Positive for cough. Negative for shortness of breath and wheezing.   Cardiovascular: Negative.   Gastrointestinal: Negative.         Objective:    Pulse 97   Temp 99.9 F (37.7 C) (Temporal)   Resp 18   Ht 5\' 7"  (1.702 m)   Wt 210 lb (95.3 kg)   SpO2 97%   BMI 32.89 kg/m    Physical Exam Constitutional:      Appearance: Normal appearance.  Neurological:     Mental Status: She is alert.   This was a tele medicine visit so no physical examination was done.  Results for orders placed or performed in visit on 12/04/23  POC COVID-19 BinaxNow  Result Value Ref Range   SARS Coronavirus 2 Ag Positive (A) Negative        Assessment & Plan:   Problem List Items Addressed This Visit       Other   Nasal congestion - Primary   Relevant Orders   POC COVID-19 BinaxNow (Completed)   COVID-19   Her COVID test is positive. I will start her on Paxlovid  and she will stay home for 7 days. She will call if her symptoms are not better. She will drink plenty of water. I will end note for work.       No orders of the defined types were placed in this encounter.   No follow-ups on file.  Tita Form, MD

## 2024-06-02 ENCOUNTER — Encounter: Payer: Managed Care, Other (non HMO) | Admitting: Internal Medicine

## 2024-06-14 ENCOUNTER — Encounter: Admitting: Internal Medicine

## 2024-06-28 ENCOUNTER — Encounter: Admitting: Internal Medicine
